# Patient Record
Sex: Male | Born: 1990 | Race: Black or African American | Hispanic: No | Marital: Single | Smoking: Former smoker
Health system: Southern US, Community
[De-identification: ages and names within clinical notes are randomized; demographics above are authoritative.]

## PROBLEM LIST (undated history)

## (undated) DIAGNOSIS — R002 Palpitations: Secondary | ICD-10-CM

## (undated) DIAGNOSIS — J302 Other seasonal allergic rhinitis: Secondary | ICD-10-CM

## (undated) HISTORY — DX: Other seasonal allergic rhinitis: J30.2

## (undated) HISTORY — DX: Palpitations: R00.2

---
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
## 2011-02-19 ENCOUNTER — Emergency Department (HOSPITAL_COMMUNITY)
Admission: EM | Admit: 2011-02-19 | Discharge: 2011-02-19 | Disposition: A | Discharge status: Home / Self Care | Payer: Managed Care, Other (non HMO) | Attending: Emergency Medicine | Admitting: Emergency Medicine

## 2011-02-19 ENCOUNTER — Emergency Department (HOSPITAL_COMMUNITY): Payer: Managed Care, Other (non HMO)

## 2011-02-19 DIAGNOSIS — R002 Palpitations: Secondary | ICD-10-CM | POA: Insufficient documentation

## 2011-02-19 LAB — POCT I-STAT, CHEM 8
BUN: 10 mg/dL (ref 6–23)
Calcium, Ion: 1.19 mmol/L (ref 1.12–1.32)
Chloride: 103 mEq/L (ref 96–112)
HCT: 46 % (ref 39.0–52.0)
Hemoglobin: 15.6 g/dL (ref 13.0–17.0)
Potassium: 3.8 mEq/L (ref 3.5–5.1)
TCO2: 27 mmol/L (ref 0–100)

## 2011-02-20 ENCOUNTER — Encounter: Payer: Self-pay | Admitting: Cardiovascular Disease

## 2011-02-24 ENCOUNTER — Other Ambulatory Visit (INDEPENDENT_AMBULATORY_CARE_PROVIDER_SITE_OTHER): Payer: Private Health Insurance - Indemnity | Admitting: *Deleted

## 2011-02-24 ENCOUNTER — Ambulatory Visit (INDEPENDENT_AMBULATORY_CARE_PROVIDER_SITE_OTHER): Payer: Private Health Insurance - Indemnity | Admitting: Cardiovascular Disease

## 2011-02-24 ENCOUNTER — Encounter: Payer: Self-pay | Admitting: Cardiovascular Disease

## 2011-02-24 VITALS — BP 136/78 | HR 75 | Resp 12 | Ht 75.0 in | Wt 187.0 lb

## 2011-02-24 DIAGNOSIS — R9389 Abnormal findings on diagnostic imaging of other specified body structures: Secondary | ICD-10-CM

## 2011-02-24 DIAGNOSIS — R079 Chest pain, unspecified: Secondary | ICD-10-CM | POA: Insufficient documentation

## 2011-02-24 DIAGNOSIS — R918 Other nonspecific abnormal finding of lung field: Secondary | ICD-10-CM

## 2011-02-24 DIAGNOSIS — R0989 Other specified symptoms and signs involving the circulatory and respiratory systems: Secondary | ICD-10-CM

## 2011-02-24 DIAGNOSIS — R002 Palpitations: Secondary | ICD-10-CM | POA: Insufficient documentation

## 2011-02-24 NOTE — Assessment & Plan Note (Signed)
Atypical with normal ECG and related to supplement and palpitations.  No need for ETT

## 2011-02-24 NOTE — Assessment & Plan Note (Signed)
Looks like benign granuloma.  No respitory symptoms other than mild reactive airway disease.  Previously on inhaler.  F/U primary CXR in 2 years

## 2011-02-24 NOTE — Progress Notes (Signed)
20 yo with no previous congenital or heart history .  Referred for SSCP and palpitations.  Seen in ER 8/22.  Reviewed labs, ECG, CXR and ER doctor records.  Palpitations coincide with medicine he was taking a supplement called "[employer]" ingredients did contain stimulant including caffeine and guarna.  Palpitations sudden onset No syncope.  Improved since stopping supplement but thinks HR fast at night when he tries to sleep.  Had atypical SSCP with palpitations.  No exertional pain.  Plays basketball with no chest pain.  Pain also improved off supplement.  ECG in ER and here normal.  No dyspnea, syncope or signs of CHF.  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  No cough, sputum or fever.  In ER did not do TSH/T4.  Has 3 brothers and a sister with no heart problems  ROS: Denies fever, malais, weight loss, blurry vision, decreased visual acuity, cough, sputum, SOB, hemoptysis, pleuritic pain, palpitaitons, heartburn, abdominal pain, melena, lower extremity edema, claudication, or rash.  All other systems reviewed and negative   General: Affect appropriate Healthy:  appears stated age HEENT: normal Neck supple with no adenopathy JVP normal no bruits no thyromegaly Lungs clear with no wheezing and good diaphragmatic motion Heart:  S1/S2 no murmur,rub, gallop or click PMI normal Abdomen: benighn, BS positve, no tenderness, no AAA no bruit.  No HSM or HJR Distal pulses intact with no bruits No edema Neuro non-focal Skin warm and dry No muscular weakness  Medications No current outpatient prescriptions on file.    Allergies Review of patient's allergies indicates not on file.  Family History: No family history on file.  Social History: History   Social History  . Marital Status: Single    Spouse Name: N/A    Number of Children: N/A  . Years of Education: N/A   Occupational History  . Not on file.   Social History Main Topics  . Smoking status: Never Smoker   . Smokeless tobacco: Not  on file  . Alcohol Use: No  . Drug Use: No  . Sexually Active: Not on file   Other Topics Concern  . Not on file   Social History Narrative  . No narrative on file    Electrocardiogram:  8/22  NSR 67 normal ECG  QT 382  Assessment and Plan

## 2011-02-24 NOTE — Assessment & Plan Note (Addendum)
Improved Normal exam and ECG  Check echo to R/O structural/congenital disease.  No need for event monitor.  Suppliment stopped  TSH and T4 today

## 2011-02-24 NOTE — Patient Instructions (Signed)
Your physician has requested that you have an echocardiogram. Echocardiography is a painless test that uses sound waves to create images of your heart. It provides your doctor with information about the size and shape of your heart and how well your heart's chambers and valves are working. This procedure takes approximately one hour. There are no restrictions for this procedure.  Your physician recommends that you return for lab work in: today  .  

## 2011-02-26 ENCOUNTER — Ambulatory Visit (HOSPITAL_COMMUNITY): Payer: Managed Care, Other (non HMO) | Attending: Cardiovascular Disease | Admitting: Radiology

## 2011-02-26 DIAGNOSIS — R002 Palpitations: Secondary | ICD-10-CM | POA: Insufficient documentation

## 2011-02-26 DIAGNOSIS — R072 Precordial pain: Secondary | ICD-10-CM

## 2011-02-27 NOTE — Progress Notes (Signed)
pt aware of results Tanner Kelly  

## 2011-03-17 ENCOUNTER — Telehealth: Payer: Self-pay | Admitting: Cardiovascular Disease

## 2011-03-17 NOTE — Telephone Encounter (Signed)
Spoke with pt, for the last several weeks he has noticed an increase in a "funny feeling" in his chest. He also reports dizziness and feeling faint. He reports drinking plenty of fluids but ask several questions regarding what to eat. Pt to avoid caffeine. He will see scott weaver on Thursday this week and will call with problems prior to the appt Deliah Goody

## 2011-03-17 NOTE — Telephone Encounter (Signed)
Pt called and states he is having more problems and wants to be seen again.  Problem is worse then when he was here and is having a lot of trouble sleeping.

## 2011-03-20 ENCOUNTER — Encounter: Payer: Self-pay | Admitting: Physician Assistant

## 2011-03-20 ENCOUNTER — Ambulatory Visit (INDEPENDENT_AMBULATORY_CARE_PROVIDER_SITE_OTHER): Payer: Managed Care, Other (non HMO) | Admitting: Physician Assistant

## 2011-03-20 DIAGNOSIS — R42 Dizziness and giddiness: Secondary | ICD-10-CM

## 2011-03-20 DIAGNOSIS — R079 Chest pain, unspecified: Secondary | ICD-10-CM

## 2011-03-20 DIAGNOSIS — J45909 Unspecified asthma, uncomplicated: Secondary | ICD-10-CM | POA: Insufficient documentation

## 2011-03-20 MED ORDER — FAMOTIDINE 20 MG PO TABS: ORAL_TABLET | ORAL | Status: AC

## 2011-03-20 MED ORDER — ALBUTEROL SULFATE HFA 108 (90 BASE) MCG/ACT IN AERS: 2.0000 | INHALATION_SPRAY | Freq: Four times a day (QID) | RESPIRATORY_TRACT | Status: AC | PRN

## 2011-03-20 MED ORDER — FAMOTIDINE 20 MG PO TABS: 20.0000 mg | ORAL_TABLET | Freq: Two times a day (BID) | ORAL | Status: DC

## 2011-03-20 NOTE — Assessment & Plan Note (Signed)
I suspect some of his symptoms may be related to mild Intermittent asthma.  I have given him a Proventil inhaler to use as needed.  He will also be referred to primary care.

## 2011-03-20 NOTE — Assessment & Plan Note (Signed)
Atypical.  He does have some exertional symptoms.  He also describes symptoms that sound consistent with acid reflux.  I have recommended a routine exercise treadmill test.  I have also recommended that he start taking Pepcid 20 mg twice a day.  He can do this for 3-4 weeks then as needed.  He can followup with me in 4 weeks.

## 2011-03-20 NOTE — Progress Notes (Signed)
History of Present Illness: Primary Cardiologist:  Dr. Charlton Haws  Tanner Kelly is a 20 y.o. male who presents for chest pain.  He saw Dr. Eden Emms initial 8/27.  He was having chest pain and palpitations.  His palpitations coincided with taking a supplement that included caffeine and guarna.  Palpitations improved with stopping the supplement.  He had no chest pain with exertion.  He had a right middle lobe granuloma noted on his chest x-ray.  Dr. Eden Emms felt that his chest symptoms were related to the supplement he took and his palpitations from that.  He did not think that the patient needed a stress test.  He did have an echocardiogram performed 8/29: EF 55-60%, normal wall motion.  TSH was normal at 1.59 and free T4 normal at 1.0.  He recommended the patient get a followup chest x-ray in 2 years with his PCP and follow up with Dr. Eden Emms as needed.  Presents back today with a lot of nonspecific symptoms.  He has CP that is left sided.  Notes it with exertion and without.  Also can exert himself without pain.  Has associated dyspnea.  Denies wheezing.  Has a cough sometimes.  Feels lightheaded sometimes.  No syncope or near syncope.  Plays basketball daily.  Notes some pain in his chest with this.  Denies arm or jaw pain.  No nausea or diaphoresis.  Pain last a few minutes.  Nothing makes any worse.  Pain no worse with lying down.  No pleuritic pain.  Pain feels like pressure.  Sometimes associated with meals.  Feels pain in epigastrium sometimes.  Has a lot of indigestion.  Still having palps but not as bad.  Past Medical History  Diagnosis Date  . Asthma   . Chest pain   . Seasonal allergies   . Heart palpitations    Medications: No current outpatient prescriptions on file.    Allergies: No Known Allergies  Social Hx:  Non smoker  Family Hx:  No CAD  ROS:  Please see the history of present illness.  All other systems reviewed and negative.   Vital Signs: BP 102/62  Pulse 83  Ht  6\' 4"  (1.93 m)  Wt 184 lb 1.9 oz (83.516 kg)  BMI 22.41 kg/m2  Filed Vitals:   03/20/11 1427 03/20/11 1429 03/20/11 1431 03/20/11 1432  BP: 141/67 109/67 106/63 102/62  Pulse: 65 69 82 83  Height:      Weight:         PHYSICAL EXAM: Well nourished, well developed, in no acute distress HEENT: normal Neck: no JVD Vascular: no carotid bruits Cardiac:  normal S1, S2; RRR; no murmur, no rubs Lungs:  clear to auscultation bilaterally, no wheezing, rhonchi or rales Abd: soft, nontender, no hepatomegaly Ext: no edema Skin: warm and dry Neuro:  CNs 2-12 intact, no focal abnormalities noted Psych: normal affect  EKG:  Sinus rhythm, heart rate 65, normal axis, J-point elevation, no ischemic changes, no significant changes  ASSESSMENT AND PLAN:

## 2011-03-20 NOTE — Patient Instructions (Signed)
Your physician recommends that you schedule a follow-up appointment in: 04/21/11 @ 8:30 TO SEE SCOTT WEAVER, PA-C  Your physician has requested that you have an exercise tolerance test DX CHEST PAIN 786.50. For further information please visit [email or web address] Please also follow instruction sheet, as given.   Your physician has recommended that you wear a 48 HOUR holter monitor DX 780.4 DIZZINESS. Holter monitors are medical devices that record the heart's electrical activity. Doctors most often use these monitors to diagnose arrhythmias. Arrhythmias are problems with the speed or rhythm of the heartbeat. The monitor is a small, portable device. You can wear one while you do your normal daily activities. This is usually used to diagnose what is causing palpitations/syncope (passing out).   Your physician has recommended you make the following change in your medication: START PEPCID 20 MG 1 TABLET TWICE DAILY FOR 3-4 WEEKS THE TAKE ONLY AS NEEDED; START PROVENTIL HFA 1-2 PUFFS EVERY 4-6 HOURS AS NEEDED AND ALSO BEFORE EXERCISE.  You have been referred to [facility]IMARY [facility]ARE @ THE [facility] FOR REFLUX FOLLOW UP

## 2011-03-20 NOTE — Assessment & Plan Note (Signed)
I reviewed the orthostatic vital signs with the CMA.  His initial pressure when entering the office was 141/67.  His blood pressure lying, sitting and standing did not change.  His heart rate did go up somewhat.  Although, this was not significant.  I have recommended that he increase his fluid intake to avoid dehydration.  I will also set him up for a 48-hour Holter.  Of note, recent echocardiogram demonstrated normal LV function.

## 2011-04-01 ENCOUNTER — Encounter (INDEPENDENT_AMBULATORY_CARE_PROVIDER_SITE_OTHER): Payer: Managed Care, Other (non HMO)

## 2011-04-01 ENCOUNTER — Ambulatory Visit (INDEPENDENT_AMBULATORY_CARE_PROVIDER_SITE_OTHER): Payer: Managed Care, Other (non HMO) | Admitting: Physician Assistant

## 2011-04-01 DIAGNOSIS — R42 Dizziness and giddiness: Secondary | ICD-10-CM

## 2011-04-01 DIAGNOSIS — R079 Chest pain, unspecified: Secondary | ICD-10-CM

## 2011-04-01 NOTE — Progress Notes (Signed)
Addended by: Tarri Fuller on: 04/01/2011 09:53 AM   Modules accepted: Orders

## 2011-04-01 NOTE — Patient Instructions (Signed)
You have been referred to PRIMARY CARE

## 2011-04-01 NOTE — Progress Notes (Signed)
Exercise Treadmill Test  Pre-Exercise Testing Evaluation Rhythm: normal sinus  Rate: 77   PR:  .17 QRS:  .08  QT:  .35 QTc: .39     Test  Exercise Tolerance Test Ordering MD: Charlton Haws, MD  Interpreting MD:  Tereso Newcomer PA-C  Unique Test No: 1  Treadmill:  1  Indication for ETT: chest pain - rule out ischemia  Contraindication to ETT: No   Stress Modality: exercise - treadmill  Cardiac Imaging Performed: non   Protocol: standard Bruce - maximal  Max BP:  148/73  Max MPHR (bpm):  198 85% MPR (bpm):  168  MPHR obtained (bpm):  187 % MPHR obtained:  95  Reached 85% MPHR (min:sec):4:40 Total Exercise Time (min-sec):  7:51  Workload in METS:  13.1 Borg Scale: 14  Reason ETT Terminated:  patient's desire to stop    ST Segment Analysis At Rest: normal ST segments - no evidence of significant ST depression With Exercise: no evidence of significant ST depression  Other Information Arrhythmia:  No Angina during ETT:  absent (0) Quality of ETT:  diagnostic  ETT Interpretation:  normal - no evidence of ischemia by ST analysis  Comments: Good exercise tolerance. Patient did complain of "a little" chest pain at end of test. Normal BP response to exercise. No ST-T changes to suggest ischemia.   Recommendations: Follow up as planned.

## 2011-04-07 ENCOUNTER — Encounter: Payer: Self-pay | Admitting: Internal Medicine

## 2011-04-07 ENCOUNTER — Ambulatory Visit (INDEPENDENT_AMBULATORY_CARE_PROVIDER_SITE_OTHER): Payer: Managed Care, Other (non HMO) | Admitting: Internal Medicine

## 2011-04-07 DIAGNOSIS — F4322 Adjustment disorder with anxiety: Secondary | ICD-10-CM

## 2011-04-07 DIAGNOSIS — R002 Palpitations: Secondary | ICD-10-CM

## 2011-04-07 NOTE — Patient Instructions (Signed)
Anxiety and Panic Attacks Your caregiver has informed you that you are having an anxiety or panic attack. There may be many forms of this. Most of the time these attacks come suddenly and without warning. They come at any time of day, including periods of sleep, and at any time of life. They may be strong and unexplained. Although panic attacks are very scary, they are physically harmless. Sometimes the cause of your anxiety is not known. Anxiety is a protective mechanism of the body in its fight or flight mechanism. Most of these perceived danger situations are actually nonphysical situations (such as anxiety over losing a job). CAUSES The causes of an anxiety or panic attack are many. Panic attacks may occur in otherwise healthy people given a certain set of circumstances. There may be a genetic cause for panic attacks. Some medications may also have anxiety as a side effect. SYMPTOMS Some of the most common feelings are:  Intense terror.  Dizziness, feeling faint.   Hot and cold flashes.   Fear of going crazy.   Feelings that nothing is real.   Sweating.   Shaking.   Chest pain or a fast heartbeat (palpitations).  Smothering, choking sensations.   Feelings of impending doom and that death is near.   Tingling of extremities, this may be from over breathing.   Altered reality (derealization).   Being detached from yourself (depersonalization).   Several symptoms can be present to make up anxiety or panic attacks. DIAGNOSIS The evaluation by your caregiver will depend on the type of symptoms you are experiencing. The diagnosis of anxiety or pain attack is made when no physical illness can be determined to be a cause of the symptoms. TREATMENT Treatment to prevent anxiety and panic attacks may include:  Avoidance of circumstances that cause anxiety.   Reassurance and relaxation.   Regular exercise.   Relaxation therapies, such as yoga.   Psychotherapy with a psychiatrist  or therapist.   Avoidance of caffeine, alcohol and illegal drugs.   Prescribed medication.  SEEK IMMEDIATE MEDICAL CARE IF:  You experience panic attack symptoms that are different than your usual symptoms.   You have any worsening or concerning symptoms.  Document Released: 06/16/2005 Document Re-Released: 12/04/2009 [employer] Patient Information 2011 [employer]. 

## 2011-04-07 NOTE — Assessment & Plan Note (Signed)
I think his symptoms are due to stress and anxiety, I informed him that his heart appears fine to me, I hope that will reassure him and help him to resolve his concerns about these symptoms

## 2011-04-07 NOTE — Assessment & Plan Note (Signed)
He is not interested in taking any meds for this, I offered him reassurance and gave him some pt info about this and he will let me know if he feel like he needs a med, I think an SSRI may benefit him
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
## 2011-04-09 ENCOUNTER — Telehealth: Payer: Self-pay | Admitting: *Deleted

## 2011-04-09 NOTE — Telephone Encounter (Signed)
Called pt to give him results of monitor. Left message with his father to have him call back

## 2011-04-10 NOTE — Telephone Encounter (Signed)
Spoke with pt, aware of monitor results Tanner Kelly

## 2011-04-10 NOTE — Telephone Encounter (Signed)
Pt returning call. Please call back.

## 2011-04-21 ENCOUNTER — Encounter: Payer: Self-pay | Admitting: Physician Assistant

## 2011-04-21 ENCOUNTER — Ambulatory Visit (INDEPENDENT_AMBULATORY_CARE_PROVIDER_SITE_OTHER): Payer: Managed Care, Other (non HMO) | Admitting: Physician Assistant

## 2011-04-21 DIAGNOSIS — R002 Palpitations: Secondary | ICD-10-CM

## 2011-04-21 DIAGNOSIS — R079 Chest pain, unspecified: Secondary | ICD-10-CM

## 2011-04-21 NOTE — Assessment & Plan Note (Signed)
Non cardiac.  Normal ETT recently.  Improved with H2RA.  Continue Pepcid.  Follow up with PCP.  Follow up with Dr. Eden Emms PRN.

## 2011-04-21 NOTE — Assessment & Plan Note (Signed)
Holter without significant arrhythmias.  Reassurance.  Follow up PRN.

## 2011-04-21 NOTE — Progress Notes (Signed)
History of Present Illness: Primary Cardiologist:  Dr. Charlton Haws  Tanner Kelly is a 20 y.o. male who presents for follow up.  He saw Dr. Eden Emms in 01/2011.  He was having chest pain and palpitations.  His palpitations coincided with taking a supplement that included caffeine and guarna.  Palpitations improved with stopping the supplement.  He had no chest pain with exertion.  He had a right middle lobe granuloma noted on his chest x-ray.  Dr. Eden Emms felt that his chest symptoms were related to the supplement he took and his palpitations from that.  He did not think that the patient needed a stress test.  Echo done 8/29: EF 55-60%, normal wall motion.  TSH was normal at 1.59 and free T4 normal at 1.0.  He recommended the patient get a followup chest x-ray in 2 years with his PCP and follow up with Dr. Eden Emms as needed.  I saw him recently with a lot of nonspecific symptoms.  He had chest pain.  Pain was sometimes associated with meals and felt in epigastrium sometimes.  Also reported a lot of indigestion.  He was still having palpitations.  I set him up for a plain ETT.  This was done 10/2 and demonstrated no ischemia.  Holter monitor done for palpitations demonstrated sinus rhythm and no significant arrhythmias.  He had some symptoms that sounded c/w asthma and I gave him an albuterol inhaler to use PRN.  He also had symptoms that sounded like GERD and I placed him on Pepcid.  We also established him with a PCP.  His recent visit indicates possible diagnosis of anxiety disorder.  Overall doing well.  Using albuterol prior to exercise and breathing much better.  No further chest pain with Pepcid.  No syncope.  We discussed his test results today and I reassured him that he does not have any cardiac causes to his symptoms.    Past Medical History  Diagnosis Date  . Asthma   . Chest pain   . Seasonal allergies   . Heart palpitations    Medications: Current Outpatient Prescriptions  Medication Sig  Dispense Refill  . albuterol (PROVENTIL HFA) 108 (90 BASE) MCG/ACT inhaler Inhale 2 puffs into the lungs every 6 (six) hours as needed for wheezing.  1 Inhaler  0  . famotidine (PEPCID) 20 MG tablet TAKE 1 TABLET TWICE DAILY FOR 3-4 WEEKS ONLY THEN TAKE ONLY AS NEEDED  60 tablet  2    Allergies: No Known Allergies  Social Hx:  Non smoker  Vital Signs: BP 112/80  Pulse 64  Ht 6\' 4"  (1.93 m)  Wt 178 lb (80.74 kg)  BMI 21.67 kg/m2    PHYSICAL EXAM: Well nourished, well developed, in no acute distress HEENT: normal Neck: no JVD Endocrine: no thyromegaly Cardiac:  normal S1, S2; RRR; no murmur, no rubs Lungs:  clear to auscultation bilaterally, no wheezing, rhonchi or rales Abd: soft, nontender, no hepatomegaly Ext: no edema Skin: warm and dry Neuro:  CNs 2-12 intact, no focal abnormalities noted  EKG:  Sinus rhythm, heart rate 64, normal axis, J-point elevation, no ischemic changes, no significant changes  ASSESSMENT AND PLAN:

## 2011-04-21 NOTE — Patient Instructions (Signed)
Your physician recommends that you schedule a follow-up appointment in: AS NEEDED AS PER SCOTT WEAVER, PA-C

## 2011-04-29 NOTE — Progress Notes (Signed)
Addended by: Tarri Fuller on: 04/29/2011 03:44 PM   Modules accepted: Orders

## 2011-06-05 ENCOUNTER — Ambulatory Visit (INDEPENDENT_AMBULATORY_CARE_PROVIDER_SITE_OTHER): Payer: Managed Care, Other (non HMO) | Admitting: Cardiovascular Disease

## 2011-06-05 ENCOUNTER — Encounter: Payer: Self-pay | Admitting: Cardiovascular Disease

## 2011-06-05 DIAGNOSIS — J45909 Unspecified asthma, uncomplicated: Secondary | ICD-10-CM

## 2011-06-05 DIAGNOSIS — R002 Palpitations: Secondary | ICD-10-CM

## 2011-06-05 DIAGNOSIS — F4322 Adjustment disorder with anxiety: Secondary | ICD-10-CM

## 2011-06-05 DIAGNOSIS — R079 Chest pain, unspecified: Secondary | ICD-10-CM

## 2011-06-05 NOTE — Assessment & Plan Note (Signed)
Improved on current inhaler

## 2011-06-05 NOTE — Assessment & Plan Note (Signed)
Resolved noncardiac.  Normal ETT

## 2011-06-05 NOTE — Assessment & Plan Note (Signed)
This is main issue still not working or going to school but parents "have backed off"  Offerred counseling by primary

## 2011-06-05 NOTE — Patient Instructions (Addendum)
Your physician recommends that you schedule a follow-up appointment in: AS NEEDED  Your physician recommends that you continue on your current medications as directed. Please refer to the Current Medication list given to you today.  

## 2011-06-05 NOTE — Progress Notes (Signed)
Tanner Kelly is a 20 y.o. male who presents for follow up. He saw Dr. Eden Emms in 01/2011. He was having chest pain and palpitations. His palpitations coincided with taking a supplement that included caffeine and guarna. Palpitations improved with stopping the supplement. He had no chest pain with exertion. He had a right middle lobe granuloma noted on his chest x-ray. His chest symptoms were related to the supplement he took and his palpitations from that [facility] saw him recently with a lot of nonspecific symptoms. He had chest pain. Pain was sometimes associated with meals and felt in epigastrium sometimes. Also reported a lot of indigestion. He was still having palpitations. I set him up for a plain ETT. This was done 10/2 and demonstrated no ischemia. Holter monitor done for palpitations demonstrated sinus rhythm and no significant arrhythmias. He had some symptoms that sounded c/w asthma and I gave him an albuterol inhaler to use PRN. He also had symptoms that sounded like GERD and I placed him on Pepcid. We also established him with a PCP. His recent visit indicates possible diagnosis of anxiety disorder.  Overall doing well. Using albuterol prior to exercise and breathing much better. No further chest pain with Pepcid. No syncope. We discussed his test results today and I reassured him that he does not have any cardiac causes to his symptoms.   ROS: Denies fever, malais, weight loss, blurry vision, decreased visual acuity, cough, sputum, SOB, hemoptysis, pleuritic pain, palpitaitons, heartburn, abdominal pain, melena, lower extremity edema, claudication, or rash.  All other systems reviewed and negative  General: Affect appropriate Healthy:  appears stated age HEENT: normal Neck supple with no adenopathy JVP normal no bruits no thyromegaly Lungs clear with no wheezing and good diaphragmatic motion Heart:  S1/S2 no murmur,rub, gallop or click PMI normal Abdomen: benighn, BS positve, no  tenderness, no AAA no bruit.  No HSM or HJR Distal pulses intact with no bruits No edema Neuro non-focal Skin warm and dry No muscular weakness   Current Outpatient Prescriptions  Medication Sig Dispense Refill  . albuterol (PROVENTIL HFA) 108 (90 BASE) MCG/ACT inhaler Inhale 2 puffs into the lungs every 6 (six) hours as needed for wheezing.  1 Inhaler  0  . famotidine (PEPCID) 20 MG tablet TAKE 1 TABLET TWICE DAILY FOR 3-4 WEEKS ONLY THEN TAKE ONLY AS NEEDED  60 tablet  2    Allergies  Review of patient's allergies indicates no known allergies.  Electrocardiogram:  Assessment and Plan

## 2011-06-05 NOTE — Assessment & Plan Note (Signed)
Improved  No arrythmia on event monitor

## 2019-04-01 ENCOUNTER — Emergency Department (HOSPITAL_COMMUNITY)
Admission: EM | Admit: 2019-04-01 | Discharge: 2019-04-01 | Disposition: A | Discharge status: Home / Self Care | Payer: BC Managed Care – PPO | Attending: Emergency Medicine | Admitting: Emergency Medicine

## 2019-04-01 ENCOUNTER — Other Ambulatory Visit: Payer: Self-pay

## 2019-04-01 DIAGNOSIS — Z20822 Contact with and (suspected) exposure to covid-19: Secondary | ICD-10-CM

## 2019-04-01 DIAGNOSIS — Z87891 Personal history of nicotine dependence: Secondary | ICD-10-CM | POA: Insufficient documentation

## 2019-04-01 DIAGNOSIS — J45909 Unspecified asthma, uncomplicated: Secondary | ICD-10-CM | POA: Insufficient documentation

## 2019-04-01 DIAGNOSIS — Z20828 Contact with and (suspected) exposure to other viral communicable diseases: Secondary | ICD-10-CM | POA: Insufficient documentation

## 2019-04-01 NOTE — Discharge Instructions (Signed)
Return to the ED for any new worsening symptoms such as chest pain, shortness of breath, high fever not being relieved by Tylenol or Motrin, throwing up liquids.

## 2019-04-01 NOTE — ED Notes (Signed)
Discharge instructions discussed with Pt. Pt verbalized understanding. Pt stable and ambulatory.    

## 2019-04-01 NOTE — ED Provider Notes (Signed)
[facility] Provider Note   CSN: [id number] Arrival date & time: 04/01/19  1106    History   Chief Complaint Chief Complaint  Patient presents with   Exposed to [place]    asymptomatic    HPI Tanner Kelly is a 28 y.o. male has medical history significant for asthma who presents for evaluation of COVID exposure.  Patient's father whom he lives with recently tested positive for COVID.  Patient denies fever, chills, nausea, vomiting, chest pain, shortness of breath, hemoptysis, abdominal pain, diarrhea dysuria.  Patient states he is here for a work note to be tested.  He appears otherwise well.  Denies additional aggravating or alleviating factors.  History obtained from patient and past medical records.  No interpreter was used.     HPI  Past Medical History:  Diagnosis Date   Asthma    Chest pain    Heart palpitations    Seasonal allergies     Patient Active Problem List   Diagnosis Date Noted   Adjustment disorder with anxiety 04/07/2011   Dizziness 03/20/2011   Asthma 03/20/2011   Chest pain 02/24/2011   Palpitations 02/24/2011   Abnormal CXR 02/24/2011    No past surgical history on file.      Home Medications    Prior to Admission medications   Medication Sig Start Date End Date Taking? Authorizing Provider  albuterol (PROVENTIL HFA) 108 (90 BASE) MCG/ACT inhaler Inhale 2 puffs into the lungs every 6 (six) hours as needed for wheezing. 03/20/11 03/19/12  Richardson Dopp T, PA-C  famotidine (PEPCID) 20 MG tablet TAKE 1 TABLET TWICE DAILY FOR 3-4 WEEKS ONLY THEN TAKE ONLY AS NEEDED 03/20/11   Liliane Shi, PA-C    Family History Family History  Problem Relation Age of Onset   Cancer Neg Hx    Heart disease Neg Hx    Hyperlipidemia Neg Hx    Hypertension Neg Hx     Social History Social History   Tobacco Use   Smoking status: Former Smoker   Tobacco comment:  a few times  Substance Use Topics     Alcohol use: No   Drug use: No     Allergies   Patient has no known allergies.   Review of Systems Review of Systems  Constitutional: Negative.   HENT: Negative.   Respiratory: Negative.   Cardiovascular: Negative.   Gastrointestinal: Negative.   Genitourinary: Negative.   Musculoskeletal: Negative.   Skin: Negative.   Neurological: Negative.   All other systems reviewed and are negative.    Physical Exam Updated Vital Signs BP 131/80 (BP Location: Left Arm)    Pulse 93    Temp 98.5 F (36.9 C) (Oral)    Resp 16    SpO2 98%   Physical Exam Vitals signs and nursing note reviewed.  Constitutional:      General: He is not in acute distress.    Appearance: He is well-developed. He is not ill-appearing, toxic-appearing or diaphoretic.  HENT:     Head: Normocephalic and atraumatic.     Nose: Nose normal.     Mouth/Throat:     Mouth: Mucous membranes are moist.     Pharynx: Oropharynx is clear.  Eyes:     Pupils: Pupils are equal, round, and reactive to light.  Neck:     Musculoskeletal: Normal range of motion and neck supple.  Cardiovascular:     Rate and Rhythm: Normal rate and regular rhythm.  Pulses: Normal pulses.     Heart sounds: Normal heart sounds.  Pulmonary:     Effort: Pulmonary effort is normal. No respiratory distress.     Breath sounds: Normal breath sounds.     Comments: Clear to auscultation bilateral without wheeze, rhonchi or rales.  Speaks in full sentences without difficulty. Abdominal:     General: Bowel sounds are normal. There is no distension.     Palpations: Abdomen is soft.     Tenderness: There is no abdominal tenderness. There is no right CVA tenderness, left CVA tenderness, guarding or rebound.  Musculoskeletal: Normal range of motion.     Comments: Moves 4 extremities without difficulty.  Homans sign negative.  Skin:    General: Skin is warm and dry.     Capillary Refill: Capillary refill takes less than 2 seconds.   Neurological:     Mental Status: He is alert.     Comments: Intact sensation.  No weakness.    ED Treatments / Results  Labs (all labs ordered are listed, but only abnormal results are displayed) Labs Reviewed  NOVEL CORONAVIRUS, NAA (HOSP ORDER, SEND-OUT TO REF LAB; TAT 18-24 HRS)    EKG None  Radiology No results found.  Procedures Procedures (including critical care time)  Medications Ordered in ED Medications - No data to display  Initial Impression / Assessment and Plan / ED Course  I have reviewed the triage vital signs and the nursing notes.  Pertinent labs & imaging results that were available during my care of the patient were reviewed by me and considered in my medical decision making (see chart for details).  28 year old male who is otherwise well presents for evaluation of COVID exposure.  He is asymptomatic.  He is afebrile, nonseptic, non-ill-appearing.  No tachycardia, tachypnea or hypoxia.  Heart and lungs clear.  Will provide work note and testing.        Tanner Kelly was evaluated in Emergency Department on 04/01/2019 for the symptoms described in the history of present illness. He was evaluated in the context of the global COVID-19 pandemic, which necessitated consideration that the patient might be at risk for infection with the SARS-CoV-2 virus that causes COVID-19. Institutional protocols and algorithms that pertain to the evaluation of patients at risk for COVID-19 are in a state of rapid change based on information released by regulatory bodies including the CDC and federal and state organizations. These policies and algorithms were followed during the patient's care in the ED. Final Clinical Impressions(s) / ED Diagnoses   Final diagnoses:  Exposure to COVID-19 virus    ED Discharge Orders    None       Staci Dack A, PA-C 04/01/19 1309    Melene Plan, DO 04/01/19 1319

## 2019-04-01 NOTE — ED Triage Notes (Signed)
Patient wanting covid test, reports exposure to someone who tested positive in family. Denies symptoms, NAD noted. VSS.

## 2019-04-02 LAB — NOVEL CORONAVIRUS, NAA (HOSP ORDER, SEND-OUT TO REF LAB; TAT 18-24 HRS): SARS-CoV-2, NAA: NOT DETECTED

## 2019-04-28 ENCOUNTER — Other Ambulatory Visit: Payer: Self-pay

## 2019-04-28 ENCOUNTER — Encounter (HOSPITAL_COMMUNITY): Payer: Self-pay

## 2019-04-28 ENCOUNTER — Ambulatory Visit (HOSPITAL_COMMUNITY)
Admission: EM | Admit: 2019-04-28 | Discharge: 2019-04-28 | Disposition: A | Discharge status: Home / Self Care | Payer: Self-pay

## 2019-04-28 DIAGNOSIS — R52 Pain, unspecified: Secondary | ICD-10-CM

## 2019-04-28 NOTE — ED Triage Notes (Signed)
Pt presents with headache, generalized body aches and dental pain X 2 days; Pt states he just got a flu vaccine on Monday.

## 2019-04-28 NOTE — ED Provider Notes (Signed)
[facility]    CSN: [id number] Arrival date & time: 04/28/19  1200      History   Chief Complaint Chief Complaint  Patient presents with  . Headache  . Generalized Body Aches  . Dental Pain    HPI Tanner Kelly is a 28 y.o. male.   Patient is a 28 year old male with past medical history of asthma, heart palpitations, allergies.  Presented today with approximately 2 days of generalized body aches, fatigue, dental pain and headache.  Symptoms have been constant, waxing waning.  He has been taking NyQuil with no relief.  Patient got his flu vaccine on Monday.  No fever, cough, chest congestion, rhinorrhea, nasal congestion, sore throat or ear pain.  ROS per HPI      Past Medical History:  Diagnosis Date  . Asthma   . Chest pain   . Heart palpitations   . Seasonal allergies     Patient Active Problem List   Diagnosis Date Noted  . Adjustment disorder with anxiety 04/07/2011  . Dizziness 03/20/2011  . Asthma 03/20/2011  . Chest pain 02/24/2011  . Palpitations 02/24/2011  . Abnormal CXR 02/24/2011    History reviewed. No pertinent surgical history.     Home Medications    Prior to Admission medications   Medication Sig Start Date End Date Taking? Authorizing Provider  albuterol (PROVENTIL HFA) 108 (90 BASE) MCG/ACT inhaler Inhale 2 puffs into the lungs every 6 (six) hours as needed for wheezing. 03/20/11 03/19/12  Tereso Newcomer T, PA-C  famotidine (PEPCID) 20 MG tablet TAKE 1 TABLET TWICE DAILY FOR 3-4 WEEKS ONLY THEN TAKE ONLY AS NEEDED 03/20/11   Beatrice Lecher, PA-C    Family History Family History  Problem Relation Age of Onset  . Cancer Neg Hx   . Heart disease Neg Hx   . Hyperlipidemia Neg Hx   . Hypertension Neg Hx     Social History Social History   Tobacco Use  . Smoking status: Former Games developer  . Tobacco comment:  a few times  Substance Use Topics  . Alcohol use: No  . Drug use: No     Allergies   Patient has no known  allergies.   Review of Systems Review of Systems   Physical Exam Triage Vital Signs ED Triage Vitals  Enc Vitals Group     BP 04/28/19 1216 125/77     Pulse Rate 04/28/19 1216 99     Resp 04/28/19 1216 18     Temp 04/28/19 1216 98.5 F (36.9 C)     Temp Source 04/28/19 1216 Oral     SpO2 04/28/19 1216 96 %     Weight --      Height --      Head Circumference --      Peak Flow --      Pain Score 04/28/19 1218 5     Pain Loc --      Pain Edu? --      Excl. in GC? --    No data found.  Updated Vital Signs BP 125/77 (BP Location: Left Arm)   Pulse 99   Temp 98.5 F (36.9 C) (Oral)   Resp 18   SpO2 96%   Visual Acuity Right Eye Distance:   Left Eye Distance:   Bilateral Distance:    Right Eye Near:   Left Eye Near:    Bilateral Near:     Physical Exam Vitals signs and nursing note reviewed.  Constitutional:      General: He is not in acute distress.    Appearance: Normal appearance. He is well-developed. He is not ill-appearing, toxic-appearing or diaphoretic.  HENT:     Head: Normocephalic and atraumatic.     Right Ear: Tympanic membrane and ear canal normal.     Left Ear: Tympanic membrane and ear canal normal.     Nose: Nose normal.     Mouth/Throat:     Pharynx: Oropharynx is clear.  Eyes:     Conjunctiva/sclera: Conjunctivae normal.  Neck:     Musculoskeletal: Normal range of motion and neck supple.  Cardiovascular:     Rate and Rhythm: Normal rate and regular rhythm.     Heart sounds: No murmur.  Pulmonary:     Effort: Pulmonary effort is normal. No respiratory distress.     Breath sounds: Normal breath sounds.  Abdominal:     Palpations: Abdomen is soft.     Tenderness: There is no abdominal tenderness.  Musculoskeletal: Normal range of motion.  Skin:    General: Skin is warm and dry.  Neurological:     Mental Status: He is alert.  Psychiatric:        Mood and Affect: Mood normal.      UC Treatments / Results  Labs (all labs  ordered are listed, but only abnormal results are displayed) Labs Reviewed - No data to display  EKG   Radiology No results found.  Procedures Procedures (including critical care time)  Medications Ordered in UC Medications - No data to display  Initial Impression / Assessment and Plan / UC Course  I have reviewed the triage vital signs and the nursing notes.  Pertinent labs & imaging results that were available during my care of the patient were reviewed by me and considered in my medical decision making (see chart for details).     Generalized body aches-most likely reaction to the flu vaccine Will have patient monitor symptoms, rest, drink fluids and take medication as needed over-the-counter Work note given Follow up as needed for continued or worsening symptoms  Final Clinical Impressions(s) / UC Diagnoses   Final diagnoses:  Generalized body aches     Discharge Instructions     This is most likely a reaction to the flu vaccine Rest they hydrated and take medications over-the-counter as needed for symptoms Work note given    ED Prescriptions    None     PDMP not reviewed this encounter.   Orvan July, NP 04/28/19 1243

## 2019-04-28 NOTE — Discharge Instructions (Signed)
This is most likely a reaction to the flu vaccine Rest they hydrated and take medications over-the-counter as needed for symptoms Work note given

## 2019-06-14 ENCOUNTER — Emergency Department (HOSPITAL_COMMUNITY)
Admission: EM | Admit: 2019-06-14 | Discharge: 2019-06-15 | Discharge status: Against Medical Advice | Payer: BC Managed Care – PPO | Attending: Emergency Medicine | Admitting: Emergency Medicine

## 2019-06-14 ENCOUNTER — Other Ambulatory Visit: Payer: Self-pay

## 2019-06-14 DIAGNOSIS — Z20828 Contact with and (suspected) exposure to other viral communicable diseases: Secondary | ICD-10-CM | POA: Insufficient documentation

## 2019-06-14 DIAGNOSIS — Z5321 Procedure and treatment not carried out due to patient leaving prior to being seen by health care provider: Secondary | ICD-10-CM | POA: Diagnosis not present

## 2019-06-14 NOTE — ED Triage Notes (Signed)
Pt states that he was exposed to Covid but has no symptoms. Just wants to get checked out.

## 2019-06-15 ENCOUNTER — Emergency Department (HOSPITAL_COMMUNITY)
Admission: EM | Admit: 2019-06-15 | Discharge: 2019-06-15 | Disposition: A | Discharge status: Home / Self Care | Payer: BC Managed Care – PPO | Source: Home / Self Care | Attending: Emergency Medicine | Admitting: Emergency Medicine

## 2019-06-15 DIAGNOSIS — R030 Elevated blood-pressure reading, without diagnosis of hypertension: Secondary | ICD-10-CM | POA: Insufficient documentation

## 2019-06-15 DIAGNOSIS — Z20822 Contact with and (suspected) exposure to covid-19: Secondary | ICD-10-CM

## 2019-06-15 DIAGNOSIS — Z87891 Personal history of nicotine dependence: Secondary | ICD-10-CM | POA: Insufficient documentation

## 2019-06-15 DIAGNOSIS — Z20828 Contact with and (suspected) exposure to other viral communicable diseases: Secondary | ICD-10-CM | POA: Insufficient documentation

## 2019-06-15 DIAGNOSIS — J45909 Unspecified asthma, uncomplicated: Secondary | ICD-10-CM | POA: Insufficient documentation

## 2019-06-15 LAB — POC SARS CORONAVIRUS 2 AG -  ED: SARS Coronavirus 2 Ag: NEGATIVE

## 2019-06-15 NOTE — ED Triage Notes (Signed)
Pt to ER - requesting to be tested for COVID, does not have any symptoms. Reports was here last night but left due to wait. VSS.

## 2019-06-15 NOTE — ED Notes (Signed)
Called patient to reassess vitals x3 and had no answer. 

## 2019-06-15 NOTE — ED Provider Notes (Signed)
[facility] Provider Note   CSN: [id number] Arrival date & time: 06/15/19  1117     History No chief complaint on file.   Tanner Kelly is a 28 y.o. male with a past medical history significant for asthma who presents to the ED requesting a Covid test.  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  Patient denies any Covid-like symptoms.  He has no other complaints today in the ER.  Patient denies cough, sore throat, rhinorrhea, chest pain, shortness of breath, abdominal pain, nausea, vomiting, and diarrhea. Patient denies fever and chills.    Past Medical History:  Diagnosis Date  . Asthma   . Chest pain   . Heart palpitations   . Seasonal allergies     Patient Active Problem List   Diagnosis Date Noted  . Adjustment disorder with anxiety 04/07/2011  . Dizziness 03/20/2011  . Asthma 03/20/2011  . Chest pain 02/24/2011  . Palpitations 02/24/2011  . Abnormal CXR 02/24/2011    No past surgical history on file.     Family History  Problem Relation Age of Onset  . Cancer Neg Hx   . Heart disease Neg Hx   . Hyperlipidemia Neg Hx   . Hypertension Neg Hx     Social History   Tobacco Use  . Smoking status: Former Research scientist (life sciences)  . Tobacco comment:  a few times  Substance Use Topics  . Alcohol use: No  . Drug use: No    Home Medications Prior to Admission medications   Medication Sig Start Date End Date Taking? Authorizing Provider  albuterol (PROVENTIL HFA) 108 (90 BASE) MCG/ACT inhaler Inhale 2 puffs into the lungs every 6 (six) hours as needed for wheezing. 03/20/11 03/19/12  Richardson Dopp T, PA-C  famotidine (PEPCID) 20 MG tablet TAKE 1 TABLET TWICE DAILY FOR 3-4 WEEKS ONLY THEN TAKE ONLY AS NEEDED 03/20/11   Richardson Dopp T, PA-C    Allergies    Acetaminophen  Review of Systems   Review of Systems  Constitutional: Negative for chills and fever.  HENT: Negative for rhinorrhea and sore throat.     Respiratory: Negative for cough and shortness of breath.   Cardiovascular: Negative for chest pain.  Gastrointestinal: Negative for abdominal pain, diarrhea, nausea and vomiting.  Neurological: Negative for headaches.    Physical Exam Updated Vital Signs BP (!) 134/95 (BP Location: Right Arm)   Pulse 75   Temp 98.7 F (37.1 C) (Oral)   Resp 18   SpO2 98%   Physical Exam Vitals and nursing note reviewed.  Constitutional:      General: He is not in acute distress.    Appearance: He is not ill-appearing.  HENT:     Head: Normocephalic.  Eyes:     Conjunctiva/sclera: Conjunctivae normal.  Cardiovascular:     Rate and Rhythm: Normal rate and regular rhythm.     Pulses: Normal pulses.     Heart sounds: Normal heart sounds. No murmur. No friction rub. No gallop.   Pulmonary:     Effort: Pulmonary effort is normal.     Breath sounds: Normal breath sounds.     Comments: Respirations equal and unlabored, patient able to speak in full sentences, lungs clear to auscultation bilaterally Abdominal:     General: Abdomen is flat. There is no distension.     Palpations: Abdomen is soft.     Tenderness: There is no abdominal tenderness. There is no guarding or  rebound.  Musculoskeletal:     Cervical back: Neck supple.     Comments: Able to move all 4 extremities without difficulty.  Skin:    General: Skin is warm and dry.  Neurological:     General: No focal deficit present.     Mental Status: He is alert.     ED Results / Procedures / Treatments   Labs (all labs ordered are listed, but only abnormal results are displayed) Labs Reviewed  NOVEL CORONAVIRUS, NAA (HOSP ORDER, SEND-OUT TO REF LAB; TAT 18-24 HRS)    EKG None  Radiology No results found.  Procedures Procedures (including critical care time)  Medications Ordered in ED Medications - No data to display  ED Course  I have reviewed the triage vital signs and the nursing notes.  Pertinent labs & imaging  results that were available during my care of the patient were reviewed by me and considered in my medical decision making (see chart for details).     28 year old male presents to the ED requesting a Covid test.  Patient's supervisor tested positive for Covid 1 week ago.  Vitals all within normal limits except mild elevation in BP at 134/95.  Patient in no acute distress and rather well-appearing.  Lungs clear to auscultation bilaterally.  Abdomen soft, nondistended, nontender.  Will send for the outpatient Covid test given patient is asymptomatic.  Patient has been advised to self quarantine until results become available. Strict ED precautions discussed with patient. Patient states understanding and agrees to plan. Patient discharged home in no acute distress and stable vitals   Tanner Kelly was evaluated in Emergency Department on 06/15/2019 for the symptoms described in the history of present illness. He was evaluated in the context of the global COVID-19 pandemic, which necessitated consideration that the patient might be at risk for infection with the SARS-CoV-2 virus that causes COVID-19. Institutional protocols and algorithms that pertain to the evaluation of patients at risk for COVID-19 are in a state of rapid change based on information released by regulatory bodies including the CDC and federal and state organizations. These policies and algorithms were followed during the patient's care in the ED.  MDM Rules/Calculators/A&P                       Final Clinical Impression(s) / ED Diagnoses Final diagnoses:  Exposure to COVID-19 virus    Rx / DC Orders ED Discharge Orders    None       Mannie Stabile, PA-C 06/15/19 1340    Virgina Norfolk, DO 06/15/19 1424

## 2019-06-15 NOTE — Discharge Instructions (Addendum)
As discussed, your Covid test is pending.  You will get your results in the next 18 to 24 hours.  Continue to self quarantine until your results become available.  If you develop a fever you may take over-the-counter Tylenol.  Covid is a viral infection and does not require antibiotics.  Treatment for Covid is all symptomatic treatment.  If you begin to develop symptoms, you may call your PCP for further evaluation.  Return to the ER for new or worsening symptoms.

## 2019-06-16 LAB — NOVEL CORONAVIRUS, NAA (HOSP ORDER, SEND-OUT TO REF LAB; TAT 18-24 HRS): SARS-CoV-2, NAA: NOT DETECTED

## 2019-10-11 ENCOUNTER — Ambulatory Visit
Admission: EM | Admit: 2019-10-11 | Discharge: 2019-10-11 | Discharge status: Absent without leave | Payer: BC Managed Care – PPO

## 2019-10-11 NOTE — ED Triage Notes (Signed)
Pt requesting a covid test. Pt denies any sx's. Pt states his friends mom was dx with covid today. States hasn't been around her just his friend.

## 2019-10-17 ENCOUNTER — Other Ambulatory Visit: Payer: BC Managed Care – PPO

## 2019-10-17 ENCOUNTER — Ambulatory Visit: Payer: BC Managed Care – PPO | Attending: Internal Medicine

## 2019-10-17 DIAGNOSIS — Z20822 Contact with and (suspected) exposure to covid-19: Secondary | ICD-10-CM

## 2019-10-18 LAB — NOVEL CORONAVIRUS, NAA: SARS-CoV-2, NAA: NOT DETECTED

## 2019-10-18 LAB — SARS-COV-2, NAA 2 DAY TAT

## 2021-04-26 ENCOUNTER — Encounter (HOSPITAL_COMMUNITY): Payer: Self-pay | Admitting: Emergency Medicine

## 2021-04-26 ENCOUNTER — Emergency Department (HOSPITAL_COMMUNITY)
Admission: EM | Admit: 2021-04-26 | Discharge: 2021-04-26 | Disposition: A | Discharge status: Home / Self Care | Payer: BC Managed Care – PPO | Attending: Physician Assistant | Admitting: Physician Assistant

## 2021-04-26 DIAGNOSIS — Z5321 Procedure and treatment not carried out due to patient leaving prior to being seen by health care provider: Secondary | ICD-10-CM | POA: Insufficient documentation

## 2021-04-26 DIAGNOSIS — K5900 Constipation, unspecified: Secondary | ICD-10-CM | POA: Insufficient documentation

## 2021-04-26 LAB — CBC WITH DIFFERENTIAL/PLATELET
Abs Immature Granulocytes: 0.01 10*3/uL (ref 0.00–0.07)
Basophils Absolute: 0 10*3/uL (ref 0.0–0.1)
Basophils Relative: 0 %
Eosinophils Absolute: 0.6 10*3/uL — ABNORMAL HIGH (ref 0.0–0.5)
Eosinophils Relative: 9 %
HCT: 47.6 % (ref 39.0–52.0)
Hemoglobin: 15.3 g/dL (ref 13.0–17.0)
Immature Granulocytes: 0 %
Lymphocytes Relative: 27 %
Lymphs Abs: 1.8 10*3/uL (ref 0.7–4.0)
MCH: 27.2 pg (ref 26.0–34.0)
MCHC: 32.1 g/dL (ref 30.0–36.0)
MCV: 84.7 fL (ref 80.0–100.0)
Monocytes Absolute: 0.6 10*3/uL (ref 0.1–1.0)
Monocytes Relative: 8 %
Neutro Abs: 3.7 10*3/uL (ref 1.7–7.7)
Neutrophils Relative %: 56 %
Platelets: 268 10*3/uL (ref 150–400)
RBC: 5.62 MIL/uL (ref 4.22–5.81)
RDW: 12.8 % (ref 11.5–15.5)
WBC: 6.7 10*3/uL (ref 4.0–10.5)
nRBC: 0 % (ref 0.0–0.2)

## 2021-04-26 LAB — COMPREHENSIVE METABOLIC PANEL
ALT: 30 U/L (ref 0–44)
AST: 29 U/L (ref 15–41)
Albumin: 3.9 g/dL (ref 3.5–5.0)
Alkaline Phosphatase: 55 U/L (ref 38–126)
Anion gap: 8 (ref 5–15)
BUN: 9 mg/dL (ref 6–20)
CO2: 23 mmol/L (ref 22–32)
Calcium: 8.7 mg/dL — ABNORMAL LOW (ref 8.9–10.3)
Chloride: 99 mmol/L (ref 98–111)
Creatinine, Ser: 1.28 mg/dL — ABNORMAL HIGH (ref 0.61–1.24)
GFR, Estimated: >60 mL/min (ref >60)
Glucose, Bld: 87 mg/dL (ref 70–99)
Potassium: 3.7 mmol/L (ref 3.5–5.1)
Sodium: 130 mmol/L — ABNORMAL LOW (ref 135–145)
Total Bilirubin: 1 mg/dL (ref 0.3–1.2)
Total Protein: 7.7 g/dL (ref 6.5–8.1)

## 2021-04-26 LAB — LIPASE, BLOOD: Lipase: 29 U/L (ref 11–51)

## 2021-04-26 NOTE — ED Notes (Signed)
Pt stated he was leaving AMA 

## 2021-04-26 NOTE — ED Triage Notes (Signed)
Patient here for evaluation of constipation. Last normal bowel movement was Monday 04/22/2021. Patient states he has been taking pepto bismal for treatment of constipation, last dose yesterday. Patient alert, oriented, and in no apparent distress at this time.

## 2021-04-26 NOTE — ED Provider Notes (Signed)
Emergency Medicine Provider Triage Evaluation Note  Tanner Kelly , a 30 y.o. male  was evaluated in triage.  Pt complains of constipation.  He has been taking pepto with out relief of symptoms.  He reports that he is having some right upper abdominal pain.  When he eats he doesn't have any change.  He notes he eats a lot of fast food and reports early satiety. No prior abdominal surgeries.   No Vomiting or diarrhea.  He reports right sided abdominal pain.   Review of Systems  Positive: Abdominal pain, constipation Negative: Fevers, dysuria  Physical Exam  BP 121/85 (BP Location: Right Arm)   Pulse 72   Temp 98.8 F (37.1 C) (Oral)   Resp 14   SpO2 100%  Gen:   Awake, no distress   Resp:  Normal effort  MSK:   Moves extremities without difficulty  Other:  RUQ TTP.    Medical Decision Making  Medically screening exam initiated at 3:58 PM.  Appropriate orders placed.  Tanner Kelly was informed that the remainder of the evaluation will be completed by another provider, this initial triage assessment does not replace that evaluation, and the importance of remaining in the ED until their evaluation is complete.  Patient reports abdominal pain, no BM for 4 days.  He eats a lot of high fat foods.  He has localized RUQ TTP.  Will obtain labs.       Cristina Gong, PA-C 04/26/21 1606    Ernie Avena, MD 04/26/21 1900

## 2021-04-28 ENCOUNTER — Other Ambulatory Visit: Payer: Self-pay

## 2021-04-28 ENCOUNTER — Encounter (HOSPITAL_COMMUNITY): Payer: Self-pay | Admitting: Emergency Medicine

## 2021-04-28 ENCOUNTER — Ambulatory Visit (HOSPITAL_COMMUNITY)
Admission: EM | Admit: 2021-04-28 | Discharge: 2021-04-28 | Disposition: A | Discharge status: Home / Self Care | Payer: Self-pay | Attending: Student | Admitting: Student

## 2021-04-28 DIAGNOSIS — A059 Bacterial foodborne intoxication, unspecified: Secondary | ICD-10-CM

## 2021-04-28 MED ORDER — ONDANSETRON 8 MG PO TBDP: 8.0000 mg | ORAL_TABLET | Freq: Three times a day (TID) | ORAL | 0 refills | Status: AC | PRN

## 2021-04-28 NOTE — Discharge Instructions (Addendum)
-  Take the Zofran (ondansetron) up to 3 times daily for nausea and vomiting. Dissolve one pill under your tongue or between your teeth and your cheek. -Drink plenty of fluids and eat bland foods

## 2021-04-28 NOTE — ED Provider Notes (Signed)
[facility]    CSN: [id number] Arrival date & time: 04/28/21  1459      History   Chief Complaint Chief Complaint  Patient presents with   Abdominal Pain    HPI Tanner Kelly is a 30 y.o. male presenting with food poisoning that has almost entirely resolved.  Medical history noncontributory.  Has already been seen in the emergency department for the symptoms.  States that after eating [employer], initially had crampy abdominal pain and episodes of watery diarrhea, he has been taking Gas-X and fiber Gummies as recommended by the emergency department with resolution of symptoms.  Still does have some decreased appetite, but bowel movements are regular and he is passing gas.  Denies nausea, vomiting.  States he needs a work note.  HPI  Past Medical History:  Diagnosis Date   Asthma    Chest pain    Heart palpitations    Seasonal allergies     Patient Active Problem List   Diagnosis Date Noted   Adjustment disorder with anxiety 04/07/2011   Dizziness 03/20/2011   Asthma 03/20/2011   Chest pain 02/24/2011   Palpitations 02/24/2011   Abnormal CXR 02/24/2011    History reviewed. No pertinent surgical history.     Home Medications    Prior to Admission medications   Medication Sig Start Date End Date Taking? Authorizing Provider  ondansetron (ZOFRAN ODT) 8 MG disintegrating tablet Take 1 tablet (8 mg total) by mouth every 8 (eight) hours as needed for nausea or vomiting. 04/28/21  Yes Rhys Martini, PA-C  albuterol (PROVENTIL HFA) 108 (90 BASE) MCG/ACT inhaler Inhale 2 puffs into the lungs every 6 (six) hours as needed for wheezing. 03/20/11 03/19/12  Tereso Newcomer T, PA-C  famotidine (PEPCID) 20 MG tablet TAKE 1 TABLET TWICE DAILY FOR 3-4 WEEKS ONLY THEN TAKE ONLY AS NEEDED 03/20/11   Beatrice Lecher, PA-C    Family History Family History  Problem Relation Age of Onset   Cancer Neg Hx    Heart disease Neg Hx    Hyperlipidemia Neg Hx    Hypertension Neg Hx      Social History Social History   Tobacco Use   Smoking status: Former   Tobacco comments:     a few times  Substance Use Topics   Alcohol use: No   Drug use: No     Allergies   Patient has no known allergies.   Review of Systems Review of Systems  Constitutional:  Negative for appetite change, chills, diaphoresis, fever and unexpected weight change.  HENT:  Negative for congestion, ear pain, sinus pressure, sinus pain, sneezing, sore throat and trouble swallowing.   Respiratory:  Negative for cough, chest tightness and shortness of breath.   Cardiovascular:  Negative for chest pain.  Gastrointestinal:  Positive for diarrhea. Negative for abdominal distention, abdominal pain, anal bleeding, blood in stool, constipation, nausea, rectal pain and vomiting.  Genitourinary:  Negative for dysuria, flank pain, frequency and urgency.  Musculoskeletal:  Negative for back pain and myalgias.  Neurological:  Negative for dizziness, light-headedness and headaches.  All other systems reviewed and are negative.   Physical Exam Triage Vital Signs ED Triage Vitals  Enc Vitals Group     BP 04/28/21 1613 127/78     Pulse Rate 04/28/21 1613 69     Resp 04/28/21 1613 15     Temp 04/28/21 1613 98.6 F (37 C)     Temp Source 04/28/21 1613 Oral  SpO2 04/28/21 1613 98 %     Weight --      Height --      Head Circumference --      Peak Flow --      Pain Score 04/28/21 1611 0     Pain Loc --      Pain Edu? --      Excl. in GC? --    No data found.  Updated Vital Signs BP 127/78 (BP Location: Left Arm)   Pulse 69   Temp 98.6 F (37 C) (Oral)   Resp 15   SpO2 98%   Visual Acuity Right Eye Distance:   Left Eye Distance:   Bilateral Distance:    Right Eye Near:   Left Eye Near:    Bilateral Near:     Physical Exam Vitals reviewed.  Constitutional:      General: He is not in acute distress.    Appearance: Normal appearance. He is not ill-appearing.  HENT:     Head:  Normocephalic and atraumatic.     Mouth/Throat:     Mouth: Mucous membranes are moist.     Comments: Moist mucous membranes Eyes:     Extraocular Movements: Extraocular movements intact.     Pupils: Pupils are equal, round, and reactive to light.  Cardiovascular:     Rate and Rhythm: Normal rate and regular rhythm.     Heart sounds: Normal heart sounds.  Pulmonary:     Effort: Pulmonary effort is normal.     Breath sounds: Normal breath sounds. No wheezing, rhonchi or rales.  Abdominal:     General: Bowel sounds are normal. There is no distension.     Palpations: Abdomen is soft. There is no mass.     Tenderness: There is no abdominal tenderness. There is no right CVA tenderness, left CVA tenderness, guarding or rebound.  Skin:    General: Skin is warm.     Capillary Refill: Capillary refill takes less than 2 seconds.     Comments: Good skin turgor  Neurological:     General: No focal deficit present.     Mental Status: He is alert and oriented to person, place, and time.  Psychiatric:        Mood and Affect: Mood normal.        Behavior: Behavior normal.     UC Treatments / Results  Labs (all labs ordered are listed, but only abnormal results are displayed) Labs Reviewed - No data to display  EKG   Radiology No results found.  Procedures Procedures (including critical care time)  Medications Ordered in UC Medications - No data to display  Initial Impression / Assessment and Plan / UC Course  I have reviewed the triage vital signs and the nursing notes.  Pertinent labs & imaging results that were available during my care of the patient were reviewed by me and considered in my medical decision making (see chart for details).     This patient is a very pleasant 30 y.o. year old male presenting for work note following food poisoning.  Reassuring exam.  Zofran sent for symptomatic relief, continue good hydration and brat diet. ED return precautions discussed. Patient  verbalizes understanding and agreement.  .   Final Clinical Impressions(s) / UC Diagnoses   Final diagnoses:  Food poisoning     Discharge Instructions      -Take the Zofran (ondansetron) up to 3 times daily for nausea and vomiting. Dissolve one pill under  your tongue or between your teeth and your cheek. -Drink plenty of fluids and eat bland foods    ED Prescriptions     Medication Sig Dispense Auth. Provider   ondansetron (ZOFRAN ODT) 8 MG disintegrating tablet Take 1 tablet (8 mg total) by mouth every 8 (eight) hours as needed for nausea or vomiting. 20 tablet Rhys Martini, PA-C      PDMP not reviewed this encounter.   Rhys Martini, PA-C 04/28/21 [phone]

## 2021-04-28 NOTE — ED Triage Notes (Signed)
Pt had abd discomfort since Tuesday after eating some [employer]. Reports will be hungry but not hungry. Denies n/v. Had some diarrhea since Tuesday morning. Tried taking Pepto Bismol. Went to ED on Friday. Starting taking Gas-X and fiber gummies Friday night after leaving ED. Since taking Gasx not having pains

## 2021-07-05 ENCOUNTER — Other Ambulatory Visit: Payer: Self-pay

## 2021-07-05 ENCOUNTER — Ambulatory Visit (HOSPITAL_COMMUNITY)
Admission: EM | Admit: 2021-07-05 | Discharge: 2021-07-05 | Disposition: A | Discharge status: Home / Self Care | Payer: Self-pay | Attending: Family Medicine | Admitting: Family Medicine

## 2021-07-05 ENCOUNTER — Encounter (HOSPITAL_COMMUNITY): Payer: Self-pay

## 2021-07-05 DIAGNOSIS — J069 Acute upper respiratory infection, unspecified: Secondary | ICD-10-CM | POA: Insufficient documentation

## 2021-07-05 DIAGNOSIS — Z87891 Personal history of nicotine dependence: Secondary | ICD-10-CM | POA: Insufficient documentation

## 2021-07-05 DIAGNOSIS — J45909 Unspecified asthma, uncomplicated: Secondary | ICD-10-CM | POA: Insufficient documentation

## 2021-07-05 DIAGNOSIS — R059 Cough, unspecified: Secondary | ICD-10-CM | POA: Insufficient documentation

## 2021-07-05 DIAGNOSIS — U071 COVID-19: Secondary | ICD-10-CM | POA: Insufficient documentation

## 2021-07-05 LAB — POC INFLUENZA A AND B ANTIGEN (URGENT CARE ONLY)
INFLUENZA A ANTIGEN, POC: NEGATIVE
INFLUENZA B ANTIGEN, POC: NEGATIVE

## 2021-07-05 NOTE — Discharge Instructions (Addendum)
Your flu test was negative.  °You have been swabbed for COVID, and the test will result in the next 24 hours. Our staff will call you if positive. If the test is positive, you should quarantine for 5 days.  °

## 2021-07-05 NOTE — ED Provider Notes (Addendum)
[facility]    CSN: [id number] Arrival date & time: 07/05/21  1509      History   Chief Complaint Chief Complaint  Patient presents with   Nasal Congestion   Headache    HPI Tanner Kelly is a 31 y.o. male.    Headache Here for h/o malaise, aches, rhinorrhea and cough since 1/4. Possible fever. No n/v/d.  Remote h/o asthma.  BMI normal.   Past Medical History:  Diagnosis Date   Asthma    Chest pain    Heart palpitations    Seasonal allergies     Patient Active Problem List   Diagnosis Date Noted   Adjustment disorder with anxiety 04/07/2011   Dizziness 03/20/2011   Asthma 03/20/2011   Chest pain 02/24/2011   Palpitations 02/24/2011   Abnormal CXR 02/24/2011    History reviewed. No pertinent surgical history.     Home Medications    Prior to Admission medications   Medication Sig Start Date End Date Taking? Authorizing Provider  albuterol (PROVENTIL HFA) 108 (90 BASE) MCG/ACT inhaler Inhale 2 puffs into the lungs every 6 (six) hours as needed for wheezing. 03/20/11 03/19/12  Tereso Newcomer T, PA-C  famotidine (PEPCID) 20 MG tablet TAKE 1 TABLET TWICE DAILY FOR 3-4 WEEKS ONLY THEN TAKE ONLY AS NEEDED 03/20/11   Tereso Newcomer T, PA-C  ondansetron (ZOFRAN ODT) 8 MG disintegrating tablet Take 1 tablet (8 mg total) by mouth every 8 (eight) hours as needed for nausea or vomiting. 04/28/21   Rhys Martini, PA-C    Family History Family History  Problem Relation Age of Onset   Cancer Neg Hx    Heart disease Neg Hx    Hyperlipidemia Neg Hx    Hypertension Neg Hx     Social History Social History   Tobacco Use   Smoking status: Former   Tobacco comments:     a few times  Substance Use Topics   Alcohol use: No   Drug use: No     Allergies   Patient has no known allergies.   Review of Systems Review of Systems  Neurological:  Positive for headaches.    Physical Exam Triage Vital Signs ED Triage Vitals  Enc Vitals Group     BP  07/05/21 1621 119/74     Pulse Rate 07/05/21 1624 82     Resp 07/05/21 1621 18     Temp 07/05/21 1621 97.9 F (36.6 C)     Temp Source 07/05/21 1621 Oral     SpO2 07/05/21 1621 100 %     Weight --      Height --      Head Circumference --      Peak Flow --      Pain Score 07/05/21 1625 0     Pain Loc --      Pain Edu? --      Excl. in GC? --    No data found.  Updated Vital Signs BP 119/74 (BP Location: Left Arm)    Pulse 82    Temp 97.9 F (36.6 C) (Oral)    Resp 18    SpO2 100%   Visual Acuity Right Eye Distance:   Left Eye Distance:   Bilateral Distance:    Right Eye Near:   Left Eye Near:    Bilateral Near:     Physical Exam Vitals reviewed.  Constitutional:      General: He is not in acute distress.  Appearance: He is not toxic-appearing or diaphoretic.  HENT:     Right Ear: Tympanic membrane and ear canal normal.     Left Ear: Tympanic membrane and ear canal normal.     Nose: Congestion present.     Mouth/Throat:     Mouth: Mucous membranes are moist.     Pharynx: Oropharyngeal exudate (clear mucus draining) present. No posterior oropharyngeal erythema.  Eyes:     Extraocular Movements: Extraocular movements intact.     Conjunctiva/sclera: Conjunctivae normal.     Pupils: Pupils are equal, round, and reactive to light.  Cardiovascular:     Rate and Rhythm: Normal rate and regular rhythm.     Heart sounds: No murmur heard. Pulmonary:     Effort: Pulmonary effort is normal.     Breath sounds: Normal breath sounds. No wheezing or rales.  Musculoskeletal:     Cervical back: Neck supple.  Skin:    Capillary Refill: Capillary refill takes less than 2 seconds.     Coloration: Skin is not jaundiced or pale.  Neurological:     General: No focal deficit present.     Mental Status: He is oriented to person, place, and time.  Psychiatric:        Behavior: Behavior normal.     UC Treatments / Results  Labs (all labs ordered are listed, but only abnormal  results are displayed) Labs Reviewed  SARS CORONAVIRUS 2 (TAT 6-24 HRS)  POC INFLUENZA A AND B ANTIGEN (URGENT CARE ONLY)    EKG   Radiology No results found.  Procedures Procedures (including critical care time)  Medications Ordered in UC Medications - No data to display  Initial Impression / Assessment and Plan / UC Course  I have reviewed the triage vital signs and the nursing notes.  Pertinent labs & imaging results that were available during my care of the patient were reviewed by me and considered in my medical decision making (see chart for details).     Flu test negative.  He will use dayquil/nyquil as needed for his symptoms Final Clinical Impressions(s) / UC Diagnoses   Final diagnoses:  Viral upper respiratory tract infection     Discharge Instructions      Your flu test was negative.  You have been swabbed for COVID, and the test will result in the next 24 hours. Our staff will call you if positive. If the test is positive, you should quarantine for 5 days.      ED Prescriptions   None    PDMP not reviewed this encounter.   Zenia Resides, MD 07/05/21 1735    Zenia Resides, MD 07/05/21 [phone]

## 2021-07-06 LAB — SARS CORONAVIRUS 2 (TAT 6-24 HRS): SARS Coronavirus 2: POSITIVE — AB

## 2021-12-17 ENCOUNTER — Ambulatory Visit
Admission: EM | Admit: 2021-12-17 | Discharge: 2021-12-17 | Disposition: A | Discharge status: Home / Self Care | Payer: Self-pay | Attending: Family Medicine | Admitting: Family Medicine

## 2021-12-17 DIAGNOSIS — H1031 Unspecified acute conjunctivitis, right eye: Secondary | ICD-10-CM

## 2021-12-17 MED ORDER — GENTAMICIN SULFATE 0.3 % OP SOLN: 2.0000 [drp] | Freq: Three times a day (TID) | OPHTHALMIC | 0 refills | Status: AC

## 2021-12-17 NOTE — ED Provider Notes (Signed)
Tanner Kelly    CSN: [id number] Arrival date & time: 12/17/21  1231      History   Chief Complaint Chief Complaint  Patient presents with   Eye Drainage    HPI Tanner Kelly is a 31 y.o. male.   HPI Here for right eye irritation.  On June 16 he felt like he got something in his eye after work.  The next day his eyelids were very swollen, his right eye was very red and he was having a lot of tearing.  No eye discharge noted.  It is improved some today, but he still has some irritation and redness to the right eye  Past Medical History:  Diagnosis Date   Asthma    Chest pain    Heart palpitations    Seasonal allergies     Patient Active Problem List   Diagnosis Date Noted   Adjustment disorder with anxiety 04/07/2011   Dizziness 03/20/2011   Asthma 03/20/2011   Chest pain 02/24/2011   Palpitations 02/24/2011   Abnormal CXR 02/24/2011    History reviewed. No pertinent surgical history.     Home Medications    Prior to Admission medications   Medication Sig Start Date End Date Taking? Authorizing Provider  gentamicin (GARAMYCIN) 0.3 % ophthalmic solution Place 2 drops into the right eye 3 (three) times daily for 5 days. 12/17/21 12/22/21 Yes Zenia Resides, MD  albuterol (PROVENTIL HFA) 108 (90 BASE) MCG/ACT inhaler Inhale 2 puffs into the lungs every 6 (six) hours as needed for wheezing. 03/20/11 03/19/12  Tereso Newcomer T, PA-C  famotidine (PEPCID) 20 MG tablet TAKE 1 TABLET TWICE DAILY FOR 3-4 WEEKS ONLY THEN TAKE ONLY AS NEEDED 03/20/11   Tereso Newcomer T, PA-C  ondansetron (ZOFRAN ODT) 8 MG disintegrating tablet Take 1 tablet (8 mg total) by mouth every 8 (eight) hours as needed for nausea or vomiting. 04/28/21   Rhys Martini, PA-C    Family History Family History  Problem Relation Age of Onset   Cancer Neg Hx    Heart disease Neg Hx    Hyperlipidemia Neg Hx    Hypertension Neg Hx     Social History Social History   Tobacco Use    Smoking status: Former   Tobacco comments:     a few times  Substance Use Topics   Alcohol use: No   Drug use: No     Allergies   Patient has no known allergies.   Review of Systems Review of Systems   Physical Exam Triage Vital Signs ED Triage Vitals  Enc Vitals Group     BP 12/17/21 1240 119/82     Pulse Rate 12/17/21 1240 76     Resp 12/17/21 1240 18     Temp 12/17/21 1240 97.6 F (36.4 C)     Temp src --      SpO2 12/17/21 1240 97 %     Weight --      Height --      Head Circumference --      Peak Flow --      Pain Score 12/17/21 1238 0     Pain Loc --      Pain Edu? --      Excl. in GC? --    No data found.  Updated Vital Signs BP 119/82   Pulse 76   Temp 97.6 F (36.4 C)   Resp 18   SpO2 97%   Visual Acuity Right  Eye Distance: 20/20 Left Eye Distance: 20/20 Bilateral Distance: 20/20  Right Eye Near:   Left Eye Near:    Bilateral Near:     Physical Exam Vitals reviewed.  Constitutional:      General: He is not in acute distress.    Appearance: He is not toxic-appearing.  HENT:     Nose: Nose normal.     Mouth/Throat:     Mouth: Mucous membranes are moist.  Eyes:     Extraocular Movements: Extraocular movements intact.     Pupils: Pupils are equal, round, and reactive to light.     Comments: The right eye is injected.  The eyelids are not swelling  Neurological:     Mental Status: He is alert and oriented to person, place, and time.  Psychiatric:        Behavior: Behavior normal.      UC Treatments / Results  Labs (all labs ordered are listed, but only abnormal results are displayed) Labs Reviewed - No data to display  EKG   Radiology No results found.  Procedures Procedures (including critical Kelly time)  Medications Ordered in UC Medications - No data to display  Initial Impression / Assessment and Plan / UC Course  I have reviewed the triage vital signs and the nursing notes.  Pertinent labs & imaging results that  were available during my Kelly of the patient were reviewed by me and considered in my medical decision making (see chart for details).     Tetracaine drops were applied to the right eye, and then fluorescein staining also was applied.  With ultraviolet light there is no corneal abrasion evident at this time.  I discussed with him that it could be that he had a small abrasion that is now healed.  I will treat with some gentamicin eyedrops Final Clinical Impressions(s) / UC Diagnoses   Final diagnoses:  Acute conjunctivitis of right eye, unspecified acute conjunctivitis type     Discharge Instructions      There is no corneal abrasion seen today.  Put gentamicin eyedrops in your right eye 3 times daily for 5 days     ED Prescriptions     Medication Sig Dispense Auth. Provider   gentamicin (GARAMYCIN) 0.3 % ophthalmic solution Place 2 drops into the right eye 3 (three) times daily for 5 days. 5 mL Zenia Resides, MD      PDMP not reviewed this encounter.   Zenia Resides, MD 12/17/21 1256

## 2021-12-17 NOTE — ED Triage Notes (Signed)
Patient presents to Urgent Care with complaints of something got in his eye at work on Friday Patient reports pain, drainage and redness. Pt reports no changes in vision.

## 2021-12-17 NOTE — Discharge Instructions (Addendum)
There is no corneal abrasion seen today.  Put gentamicin eyedrops in your right eye 3 times daily for 5 days
